# Patient Record
Sex: Male | Born: 1990 | Race: Black or African American | Hispanic: Yes | Marital: Single | Smoking: Current every day smoker
Health system: Southern US, Community
[De-identification: ages and names within clinical notes are randomized; demographics above are authoritative.]

## PROBLEM LIST (undated history)

## (undated) DIAGNOSIS — F32A Depression, unspecified: Secondary | ICD-10-CM

---
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
## 2010-06-14 ENCOUNTER — Emergency Department (HOSPITAL_COMMUNITY)
Admission: EM | Admit: 2010-06-14 | Discharge: 2010-06-14 | Disposition: A | Discharge status: Other Acute Inpatient Hospital | Payer: Self-pay | Source: Home / Self Care | Admitting: Family Medicine

## 2010-06-14 ENCOUNTER — Observation Stay (HOSPITAL_COMMUNITY)
Admission: EM | Admit: 2010-06-14 | Discharge: 2010-06-15 | Payer: Self-pay | Source: Home / Self Care | Attending: Otolaryngology | Admitting: Otolaryngology

## 2010-07-02 ENCOUNTER — Ambulatory Visit (HOSPITAL_COMMUNITY)
Admission: RE | Admit: 2010-07-02 | Discharge: 2010-07-02 | Payer: Self-pay | Source: Home / Self Care | Attending: Otolaryngology | Admitting: Otolaryngology

## 2010-07-08 ENCOUNTER — Ambulatory Visit (HOSPITAL_COMMUNITY)
Admission: RE | Admit: 2010-07-08 | Discharge: 2010-07-08 | Payer: Self-pay | Source: Home / Self Care | Attending: Otolaryngology | Admitting: Otolaryngology

## 2010-07-15 ENCOUNTER — Ambulatory Visit (HOSPITAL_COMMUNITY)
Admission: RE | Admit: 2010-07-15 | Discharge: 2010-07-15 | Payer: Self-pay | Source: Home / Self Care | Attending: Otolaryngology | Admitting: Otolaryngology

## 2010-07-17 LAB — CBC
HCT: 41.6 % (ref 39.0–52.0)
Hemoglobin: 14.4 g/dL (ref 13.0–17.0)
MCH: 31 pg (ref 26.0–34.0)
MCHC: 34.6 g/dL (ref 30.0–36.0)
MCV: 89.5 fL (ref 78.0–100.0)
Platelets: 262 10*3/uL (ref 150–400)
RBC: 4.65 MIL/uL (ref 4.22–5.81)
RDW: 12.6 % (ref 11.5–15.5)
WBC: 9.1 10*3/uL (ref 4.0–10.5)

## 2010-07-17 LAB — SURGICAL PCR SCREEN
MRSA, PCR: NEGATIVE
Staphylococcus aureus: NEGATIVE
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
## 2010-09-09 LAB — POCT I-STAT, CHEM 8
BUN: 11 mg/dL (ref 6–23)
Calcium, Ion: 1.11 mmol/L — ABNORMAL LOW (ref 1.12–1.32)
Chloride: 102 mEq/L (ref 96–112)
Creatinine, Ser: 1.1 mg/dL (ref 0.4–1.5)
Glucose, Bld: 101 mg/dL — ABNORMAL HIGH (ref 70–99)
HCT: 47 % (ref 39.0–52.0)
Hemoglobin: 16 g/dL (ref 13.0–17.0)
Potassium: 3.5 mEq/L (ref 3.5–5.1)
Sodium: 141 mEq/L (ref 135–145)
TCO2: 27 mmol/L (ref 0–100)

## 2011-06-16 ENCOUNTER — Emergency Department (HOSPITAL_COMMUNITY)
Admission: EM | Admit: 2011-06-16 | Discharge: 2011-06-17 | Discharge status: Against Medical Advice | Payer: Self-pay | Attending: Emergency Medicine | Admitting: Emergency Medicine

## 2011-06-16 ENCOUNTER — Encounter: Payer: Self-pay | Admitting: Emergency Medicine

## 2011-06-16 DIAGNOSIS — R51 Headache: Secondary | ICD-10-CM | POA: Insufficient documentation

## 2011-06-16 NOTE — ED Notes (Signed)
PT. REPORTS HEADACHE FOR 10 DAYS , DENIES INJURY ,  DENIES NAUSEA , VOMITTING OR BLURRED VISION . NO FEVER / SLIGHT CHILLS.

## 2011-06-16 NOTE — ED Notes (Signed)
Called pt in general, triage, ped waiting room and outside of ED, pt is not answer

## 2011-06-17 NOTE — ED Notes (Signed)
Called patient 3 times and NO ANSWER. 

## 2011-06-17 NOTE — ED Notes (Signed)
No answer in all waiting rooms 

## 2011-08-31 ENCOUNTER — Emergency Department (HOSPITAL_COMMUNITY)
Admission: EM | Admit: 2011-08-31 | Discharge: 2011-09-01 | Disposition: A | Discharge status: Home / Self Care | Payer: Self-pay | Attending: Emergency Medicine | Admitting: Emergency Medicine

## 2011-08-31 ENCOUNTER — Encounter (HOSPITAL_COMMUNITY): Payer: Self-pay | Admitting: *Deleted

## 2011-08-31 DIAGNOSIS — F329 Major depressive disorder, single episode, unspecified: Secondary | ICD-10-CM

## 2011-08-31 DIAGNOSIS — T50901A Poisoning by unspecified drugs, medicaments and biological substances, accidental (unintentional), initial encounter: Secondary | ICD-10-CM | POA: Insufficient documentation

## 2011-08-31 DIAGNOSIS — T50904A Poisoning by unspecified drugs, medicaments and biological substances, undetermined, initial encounter: Secondary | ICD-10-CM | POA: Insufficient documentation

## 2011-08-31 DIAGNOSIS — F3289 Other specified depressive episodes: Secondary | ICD-10-CM | POA: Insufficient documentation

## 2011-08-31 LAB — CBC
HCT: 40.6 % (ref 39.0–52.0)
Hemoglobin: 14.2 g/dL (ref 13.0–17.0)
MCH: 31.6 pg (ref 26.0–34.0)
MCHC: 35 g/dL (ref 30.0–36.0)
MCV: 90.4 fL (ref 78.0–100.0)
Platelets: 281 10*3/uL (ref 150–400)
RBC: 4.49 MIL/uL (ref 4.22–5.81)
RDW: 12.9 % (ref 11.5–15.5)
WBC: 7.9 10*3/uL (ref 4.0–10.5)

## 2011-08-31 LAB — COMPREHENSIVE METABOLIC PANEL
ALT: 13 U/L (ref 0–53)
AST: 21 U/L (ref 0–37)
Albumin: 4.1 g/dL (ref 3.5–5.2)
Alkaline Phosphatase: 94 U/L (ref 39–117)
BUN: 9 mg/dL (ref 6–23)
CO2: 30 mEq/L (ref 19–32)
Calcium: 9.3 mg/dL (ref 8.4–10.5)
Chloride: 100 mEq/L (ref 96–112)
Creatinine, Ser: 0.9 mg/dL (ref 0.50–1.35)
GFR calc Af Amer: >90 mL/min (ref >90)
GFR calc non Af Amer: >90 mL/min (ref >90)
Glucose, Bld: 80 mg/dL (ref 70–99)
Potassium: 3.2 mEq/L — ABNORMAL LOW (ref 3.5–5.1)
Sodium: 137 mEq/L (ref 135–145)
Total Bilirubin: 0.3 mg/dL (ref 0.3–1.2)
Total Protein: 7.5 g/dL (ref 6.0–8.3)

## 2011-08-31 LAB — ETHANOL: Alcohol, Ethyl (B): <11 mg/dL (ref 0–11)

## 2011-08-31 NOTE — ED Notes (Signed)
WUJ:[id number]<YN> Expected date:08/31/11<BR> Expected time: 9:50 PM<BR> Means of arrival:Ambulance<BR> Comments:<BR> Triage on arrival EMS 50 GC -- Drank eyedrops

## 2011-08-31 NOTE — ED Notes (Signed)
Per EMS- pt in c/o overdose after trying to kill himself with eye drops.

## 2011-08-31 NOTE — ED Notes (Signed)
Pt expressed wanting to leave- informed this Clinical research associate his parents were coming to get him- Spoke with ACT and EDP Clarene Duke.  IVC papers will be written for this pt.

## 2011-09-01 LAB — RAPID URINE DRUG SCREEN, HOSP PERFORMED
Amphetamines: NOT DETECTED
Barbiturates: NOT DETECTED
Benzodiazepines: NOT DETECTED
Cocaine: NOT DETECTED
Opiates: NOT DETECTED
Tetrahydrocannabinol: POSITIVE — AB

## 2011-09-01 MED ORDER — ALUM & MAG HYDROXIDE-SIMETH 200-200-20 MG/5ML PO SUSP: 30.0000 mL | ORAL | Status: DC | PRN

## 2011-09-01 MED ORDER — ONDANSETRON HCL 4 MG PO TABS: 4.0000 mg | ORAL_TABLET | Freq: Three times a day (TID) | ORAL | Status: DC | PRN

## 2011-09-01 MED ORDER — ACETAMINOPHEN 325 MG PO TABS: 650.0000 mg | ORAL_TABLET | ORAL | Status: DC | PRN

## 2011-09-01 MED ORDER — IBUPROFEN 200 MG PO TABS: 400.0000 mg | ORAL_TABLET | Freq: Three times a day (TID) | ORAL | Status: DC | PRN

## 2011-09-01 MED ORDER — NICOTINE 21 MG/24HR TD PT24: 21.0000 mg | MEDICATED_PATCH | Freq: Every day | TRANSDERMAL | Status: DC | PRN

## 2011-09-01 MED ORDER — ZOLPIDEM TARTRATE 5 MG PO TABS: 5.0000 mg | ORAL_TABLET | Freq: Every evening | ORAL | Status: DC | PRN

## 2011-09-01 MED ORDER — LORAZEPAM 1 MG PO TABS: 1.0000 mg | ORAL_TABLET | Freq: Three times a day (TID) | ORAL | Status: DC | PRN

## 2011-09-01 NOTE — BH Assessment (Signed)
IVC paperwork prepared and faxed to Hart Carwin who confirms receipt.

## 2011-09-01 NOTE — ED Provider Notes (Signed)
History     CSN: [id number]  Arrival date & time 08/31/11  2151   First MD Initiated Contact with Patient 08/31/11 2215      Chief Complaint  Patient presents with  . Medical Clearance    The history is provided by the patient and the EMS personnel. History Limited By: uncooperative.  Pt was seen at 2220.  Per pt and EMS report, pt states he "drank 1/2 a bottle of eye drops" in SA PTA.  Pt states he "just was trying to kill myself" because he has been "depressed."  Will not be forthcoming with any more information.    History reviewed. No pertinent past medical history.  History reviewed. No pertinent past surgical history.  History  Substance Use Topics  . Smoking status: Current Everyday Smoker  . Smokeless tobacco: Not on file  . Alcohol Use: No      Review of Systems  Unable to perform ROS: Other    Allergies  Review of patient's allergies indicates no known allergies.  Home Medications   Current Outpatient Rx  Name Route Sig Dispense Refill  . MOMETASONE FUROATE 50 MCG/ACT NA SUSP Nasal Place 2 sprays into the nose daily.      BP 113/79  Pulse 51  Temp(Src) 98.3 F (36.8 C) (Oral)  Resp 16  SpO2 100%  Physical Exam 2225: Physical examination:  Nursing notes reviewed; Vital signs and O2 SAT reviewed;  Constitutional: Well developed, Well nourished, Well hydrated, In no acute distress; Head:  Normocephalic, atraumatic; Eyes: EOMI, PERRL, No scleral icterus; ENMT: Mouth and pharynx normal, Mucous membranes moist; Neck: Supple, Full range of motion, No lymphadenopathy; Cardiovascular: Regular rate and rhythm, No murmur, rub, or gallop; Respiratory: Breath sounds clear & equal bilaterally, No rales, rhonchi, wheezes, or rub, Normal respiratory effort/excursion; Chest: Nontender, Movement normal; Abdomen: Soft, Nontender, Nondistended, Normal bowel sounds; Genitourinary: No CVA tenderness; Extremities: Pulses normal, No tenderness, No edema, No calf edema or  asymmetry.; Neuro: AA&Ox3, Major CN grossly intact.  No gross focal motor or sensory deficits in extremities.; Skin: Color normal, Warm, Dry; Psych:  Affect flat, poor eye contact, +depression, +SA/SI.    ED Course  Procedures  2330:  Pt telling multiple ED staff that he is "leaving now."  Concern that pt has not been completely forthcoming regarding the cause as to why he "drank eyedrops" tonight, SI/SA, etc, and he needs ACT or Telepsych eval.  +IVC paperwork completed.  Sign out to Dr. Hyacinth Meeker.    MDM  MDM Reviewed: nursing note and vitals Interpretation: labs   Results for orders placed during the hospital encounter of 08/31/11  CBC      Component Value Range   WBC 7.9  4.0 - 10.5 (K/uL)   RBC 4.49  4.22 - 5.81 (MIL/uL)   Hemoglobin 14.2  13.0 - 17.0 (g/dL)   HCT 40.9  81.1 - 91.4 (%)   MCV 90.4  78.0 - 100.0 (fL)   MCH 31.6  26.0 - 34.0 (pg)   MCHC 35.0  30.0 - 36.0 (g/dL)   RDW 78.2  95.6 - 21.3 (%)   Platelets 281  150 - 400 (K/uL)  COMPREHENSIVE METABOLIC PANEL      Component Value Range   Sodium 137  135 - 145 (mEq/L)   Potassium 3.2 (*) 3.5 - 5.1 (mEq/L)   Chloride 100  96 - 112 (mEq/L)   CO2 30  19 - 32 (mEq/L)   Glucose, Bld 80  70 - 99 (mg/dL)  BUN 9  6 - 23 (mg/dL)   Creatinine, Ser 1.61  0.50 - 1.35 (mg/dL)   Calcium 9.3  8.4 - 09.6 (mg/dL)   Total Protein 7.5  6.0 - 8.3 (g/dL)   Albumin 4.1  3.5 - 5.2 (g/dL)   AST 21  0 - 37 (U/L)   ALT 13  0 - 53 (U/L)   Alkaline Phosphatase 94  39 - 117 (U/L)   Total Bilirubin 0.3  0.3 - 1.2 (mg/dL)   GFR calc non Af Amer >90  >90 (mL/min)   GFR calc Af Amer >90  >90 (mL/min)  ETHANOL      Component Value Range   Alcohol, Ethyl (B) <11  0 - 11 (mg/dL)  URINE RAPID DRUG SCREEN (HOSP PERFORMED)      Component Value Range   Opiates NONE DETECTED  NONE DETECTED    Cocaine NONE DETECTED  NONE DETECTED    Benzodiazepines NONE DETECTED  NONE DETECTED    Amphetamines NONE DETECTED  NONE DETECTED    Tetrahydrocannabinol  POSITIVE (*) NONE DETECTED    Barbiturates NONE DETECTED  NONE DETECTED               Laray Anger, DO 09/02/11 2330

## 2011-09-01 NOTE — ED Provider Notes (Signed)
  Physical Exam  BP 102/63  Pulse 59  Temp(Src) 98.3 F (36.8 C) (Oral)  Resp 16  SpO2 100%  Physical Exam  ED Course  Procedures  MDM Change of shift, care accepted at signout. Patient reevaluated with behavioral health assessment team member, patient denies suicidal ideation or attempt. He admits that he was trying to get attention from his family by doing something out of the ordinary for him. At this time he sees that this was a mistake and can see that this was not the appropriate way to handle his discontent with this situation. He is amenable to following up as an outpatient with behavioral health, has no suicidal thoughts or intention to hurt himself at this time and has signed a safety contract prior to discharge. Involuntary commitment paper overturned, Engineer, manufacturing systems signed, patient follow up.      Vida Roller, MD 09/01/11 [phone]

## 2011-09-01 NOTE — Discharge Instructions (Signed)
 RESOURCE GUIDE  Dental Problems  Patients with Medicaid: [facility]                     [facility] [address]                                           [address] Phone:  [phone]                                                  Phone:  [phone]  If unable to pay or uninsured, contact:  [facility] or [facility] to become qualified for the adult dental clinic.  Chronic Pain Problems Contact [facility]  [phone] Patients need to be referred by their primary care doctor.  Insufficient Money for Medicine Contact [employer]:  call "211" or [facility] 271-5999.  No Primary Care Doctor Call [facility]  832-8000 Other agencies that provide inexpensive medical care    [facility]  [phone]    [facility]  [phone]    [facility]  [phone]    [facility]  [phone]    [facility]  [phone]    [facility]  [phone]  Psychological Services [facility]  [phone] [facility]  [phone] [facility]   800 853-5163 (emergency services 641-4993)  Substance Abuse Resources [facility]  336-882-2125 [facility] 336-784-9470 [facility] 336-285-9073 [facility] 336-845-3988 Residential & Outpatient Substance Abuse Program  800-659-3381  Abuse/Neglect [place] Child Abuse Hotline (336) 641-3795 [place] Child Abuse Hotline 800-378-5315 (After Hours)  Emergency Shelter  [facility] (336) 271-5985  [facility] (336) 275-9566 [facility] (704) 372-4663  MRSA Hotline #:   832-7006    [place] Resources  [facility] [place]     [employer]                          [facility] [address] [place]                       [address]      [address]                                                  [place]                            [place] Phone:  [phone]                                   Phone:  [phone]                 Phone:  [phone]  [facility] Phone:    [phone]  [place] Child Abuse Hotline (336) 342-1394 (336) 342-3537 (After Hours)   
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
## 2012-07-29 ENCOUNTER — Emergency Department (HOSPITAL_COMMUNITY)
Admission: EM | Admit: 2012-07-29 | Discharge: 2012-07-29 | Disposition: A | Discharge status: Home / Self Care | Payer: Self-pay | Attending: Emergency Medicine | Admitting: Emergency Medicine

## 2012-07-29 ENCOUNTER — Emergency Department (HOSPITAL_COMMUNITY): Payer: Self-pay

## 2012-07-29 DIAGNOSIS — R42 Dizziness and giddiness: Secondary | ICD-10-CM | POA: Insufficient documentation

## 2012-07-29 DIAGNOSIS — R51 Headache: Secondary | ICD-10-CM

## 2012-07-29 DIAGNOSIS — F17200 Nicotine dependence, unspecified, uncomplicated: Secondary | ICD-10-CM | POA: Insufficient documentation

## 2012-07-29 DIAGNOSIS — Y9389 Activity, other specified: Secondary | ICD-10-CM | POA: Insufficient documentation

## 2012-07-29 DIAGNOSIS — Y92410 Unspecified street and highway as the place of occurrence of the external cause: Secondary | ICD-10-CM | POA: Insufficient documentation

## 2012-07-29 MED ORDER — CYCLOBENZAPRINE HCL 5 MG PO TABS: 10.0000 mg | ORAL_TABLET | Freq: Two times a day (BID) | ORAL | Status: DC | PRN

## 2012-07-29 MED ORDER — IBUPROFEN 800 MG PO TABS
800.0000 mg | ORAL_TABLET | Freq: Once | ORAL | Status: AC
  Administered 2012-07-29: 800 mg via ORAL
  Filled 2012-07-29: qty 1
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
## 2012-08-02 NOTE — ED Provider Notes (Signed)
Medical screening examination/treatment/procedure(s) were performed by non-physician practitioner and as supervising physician I was immediately available for consultation/collaboration.  Doyle Kunath, MD 08/02/12 1446 
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
## 2015-12-01 ENCOUNTER — Emergency Department (HOSPITAL_COMMUNITY)
Admission: EM | Admit: 2015-12-01 | Discharge: 2015-12-01 | Disposition: A | Discharge status: Home / Self Care | Payer: No Typology Code available for payment source | Attending: Emergency Medicine | Admitting: Emergency Medicine

## 2015-12-01 ENCOUNTER — Encounter (HOSPITAL_COMMUNITY): Payer: Self-pay

## 2015-12-01 DIAGNOSIS — A6002 Herpesviral infection of other male genital organs: Secondary | ICD-10-CM | POA: Insufficient documentation

## 2015-12-01 DIAGNOSIS — A6000 Herpesviral infection of urogenital system, unspecified: Secondary | ICD-10-CM

## 2015-12-01 DIAGNOSIS — F17200 Nicotine dependence, unspecified, uncomplicated: Secondary | ICD-10-CM | POA: Insufficient documentation

## 2015-12-01 MED ORDER — ACYCLOVIR 400 MG PO TABS: 400.0000 mg | ORAL_TABLET | Freq: Three times a day (TID) | ORAL | Status: DC

## 2015-12-01 NOTE — ED Provider Notes (Signed)
CSN: [id number]     Arrival date & time 12/01/15  1746 History  By signing my name below, I, Linna DarnerRussell Turner, attest that this documentation has been prepared under the direction and in the presence of Sealed Air CorporationHeather Kamari Buch, PA-C . Electronically Signed: Linna Darnerussell Turner, Scribe. 12/01/2015. 7:01 PM.   Chief Complaint  Patient presents with  . Penis Pain    The history is provided by the patient. No language interpreter was used.     HPI Comments: Steven Hutchinson is a 25 y.o. male who presents to the Emergency Department complaining of sudden onset, constant, worsening, penile pain for the last week. Pt endorses associated worsening scratches to his foreskin; he is uncircumcised. Pt reports that he was seen here for the same symptoms recently and was told to apply neosporin to his scratches. Pt has been applying neosporin but states that he has a problem with masturbation that has not allowed his cuts to heal. Pt notes that he has not been masturbating in the last few days but was masturbating 2-3 times a day prior. He states that he has not been sexually active for the last week and a half. Pt denies dysuria, fever, swelling of his penis/scrotum, penile discharge, or any other associated symptoms.  History reviewed. No pertinent past medical history. History reviewed. No pertinent past surgical history. No family history on file. Social History  Substance Use Topics  . Smoking status: Current Every Day Smoker  . Smokeless tobacco: None  . Alcohol Use: No    Review of Systems  A complete 10 system review of systems was obtained and all systems are negative except as noted in the HPI and PMH.   Allergies  Review of patient's allergies indicates no known allergies.  Home Medications   Prior to Admission medications   Medication Sig Start Date End Date Taking? Authorizing Provider  acetaminophen (TYLENOL) 325 MG tablet Take 650 mg by mouth every 6 (six) hours as needed. Pain.    Historical  Provider, MD  cyclobenzaprine (FLEXERIL) 5 MG tablet Take 2 tablets (10 mg total) by mouth 2 (two) times daily as needed for muscle spasms. 07/29/12   Tiffany Neva SeatGreene, PA-C   BP 113/79 mmHg  Pulse 72  Temp(Src) 99.6 F (37.6 C) (Oral)  Resp 18  SpO2 99% Physical Exam  Constitutional: He is oriented to person, place, and time. He appears well-developed and well-nourished. No distress.  HENT:  Head: Normocephalic and atraumatic.  Eyes: Conjunctivae and EOM are normal.  Neck: Neck supple. No tracheal deviation present.  Cardiovascular: Normal rate and regular rhythm.   Pulmonary/Chest: Effort normal and breath sounds normal. No respiratory distress.  Genitourinary:  Ulcerative lesions of the foreskin. Inguinal lymph node on the right. No tenderness or swelling of the scrotum.  Musculoskeletal: Normal range of motion.  Neurological: He is alert and oriented to person, place, and time.  Skin: Skin is warm and dry.  Psychiatric: He has a normal mood and affect. His behavior is normal.  Nursing note and vitals reviewed.   ED Course  Procedures (including critical care time)  DIAGNOSTIC STUDIES: Oxygen Saturation is 99% on RA, normal by my interpretation.    COORDINATION OF CARE: 7:01 PM Discussed treatment plan with pt at bedside and pt agreed to plan.  Labs Review Labs Reviewed - No data to display  Imaging Review No results found. I have personally reviewed and evaluated these images and lab results as part of my medical decision-making.   EKG Interpretation  None      MDM   Final diagnoses:  None   Patient presents today with wounds of the foreskin.  He has ulcerated lesions of the foreskin area.  Appearance most consistent with Genital Herpes.  GC/Chlamydia, RPR, and HIV pending.  Patient given Rx for Acyclovir.  Stable for discharge.  Return precautions given.  I personally performed the services described in this documentation, which was scribed in my presence. The  recorded information has been reviewed and is accurate.   Santiago Glad, PA-C 12/02/15 0021  Marily Memos, MD 12/02/15 [phone]

## 2015-12-01 NOTE — ED Notes (Addendum)
States has been applying Neosporin to abrasions "cut areas" on penis. Denies any urinary problems.

## 2015-12-01 NOTE — ED Notes (Addendum)
Patient here with 1-2 weeks of penile pain. States that he has scratches on penis and he has ben cleaning same but wants to make sure they are healing. Also wants right side of groin checked for swelling

## 2015-12-01 NOTE — ED Notes (Signed)
H Laisure, PA, in w/pt. 

## 2015-12-02 LAB — RPR: RPR Ser Ql: NONREACTIVE

## 2015-12-02 LAB — HIV ANTIBODY (ROUTINE TESTING W REFLEX): HIV SCREEN 4TH GENERATION: NONREACTIVE

## 2015-12-03 LAB — GC/CHLAMYDIA PROBE AMP (~~LOC~~) NOT AT ARMC
CHLAMYDIA, DNA PROBE: NEGATIVE
Neisseria Gonorrhea: NEGATIVE

## 2022-11-05 DIAGNOSIS — L298 Other pruritus: Secondary | ICD-10-CM | POA: Diagnosis not present

## 2022-11-05 DIAGNOSIS — F319 Bipolar disorder, unspecified: Secondary | ICD-10-CM | POA: Diagnosis not present

## 2022-11-05 DIAGNOSIS — Z87891 Personal history of nicotine dependence: Secondary | ICD-10-CM | POA: Diagnosis not present

## 2022-11-05 DIAGNOSIS — R590 Localized enlarged lymph nodes: Secondary | ICD-10-CM | POA: Diagnosis not present

## 2022-11-05 DIAGNOSIS — R300 Dysuria: Secondary | ICD-10-CM | POA: Diagnosis not present

## 2022-11-05 DIAGNOSIS — Z202 Contact with and (suspected) exposure to infections with a predominantly sexual mode of transmission: Secondary | ICD-10-CM | POA: Diagnosis not present

## 2022-11-05 DIAGNOSIS — F1290 Cannabis use, unspecified, uncomplicated: Secondary | ICD-10-CM | POA: Diagnosis not present

## 2023-03-09 DIAGNOSIS — Z87891 Personal history of nicotine dependence: Secondary | ICD-10-CM | POA: Diagnosis not present

## 2023-03-09 DIAGNOSIS — B349 Viral infection, unspecified: Secondary | ICD-10-CM | POA: Diagnosis not present

## 2023-03-09 DIAGNOSIS — R52 Pain, unspecified: Secondary | ICD-10-CM | POA: Diagnosis not present

## 2023-03-09 DIAGNOSIS — R059 Cough, unspecified: Secondary | ICD-10-CM | POA: Diagnosis not present

## 2023-03-09 DIAGNOSIS — F1290 Cannabis use, unspecified, uncomplicated: Secondary | ICD-10-CM | POA: Diagnosis not present

## 2023-03-09 DIAGNOSIS — R1110 Vomiting, unspecified: Secondary | ICD-10-CM | POA: Diagnosis not present

## 2023-03-09 DIAGNOSIS — U071 COVID-19: Secondary | ICD-10-CM | POA: Diagnosis not present

## 2023-03-09 DIAGNOSIS — R531 Weakness: Secondary | ICD-10-CM | POA: Diagnosis not present

## 2023-06-14 ENCOUNTER — Ambulatory Visit (HOSPITAL_COMMUNITY)
Admission: EM | Admit: 2023-06-14 | Discharge: 2023-06-14 | Disposition: A | Discharge status: Other Acute Inpatient Hospital | Payer: 59

## 2023-06-14 ENCOUNTER — Observation Stay (HOSPITAL_COMMUNITY)
Admission: EM | Admit: 2023-06-14 | Discharge: 2023-06-17 | Disposition: A | Discharge status: Other Acute Inpatient Hospital | Payer: 59 | Attending: Family Medicine | Admitting: Family Medicine

## 2023-06-14 ENCOUNTER — Other Ambulatory Visit: Payer: Self-pay

## 2023-06-14 ENCOUNTER — Encounter (HOSPITAL_COMMUNITY): Payer: Self-pay | Admitting: *Deleted

## 2023-06-14 DIAGNOSIS — D72829 Elevated white blood cell count, unspecified: Secondary | ICD-10-CM | POA: Diagnosis not present

## 2023-06-14 DIAGNOSIS — T1491XA Suicide attempt, initial encounter: Secondary | ICD-10-CM | POA: Diagnosis not present

## 2023-06-14 DIAGNOSIS — R799 Abnormal finding of blood chemistry, unspecified: Secondary | ICD-10-CM | POA: Insufficient documentation

## 2023-06-14 DIAGNOSIS — F17210 Nicotine dependence, cigarettes, uncomplicated: Secondary | ICD-10-CM | POA: Insufficient documentation

## 2023-06-14 DIAGNOSIS — X58XXXA Exposure to other specified factors, initial encounter: Secondary | ICD-10-CM | POA: Insufficient documentation

## 2023-06-14 DIAGNOSIS — F32A Depression, unspecified: Secondary | ICD-10-CM | POA: Insufficient documentation

## 2023-06-14 DIAGNOSIS — R001 Bradycardia, unspecified: Secondary | ICD-10-CM | POA: Insufficient documentation

## 2023-06-14 DIAGNOSIS — R9431 Abnormal electrocardiogram [ECG] [EKG]: Secondary | ICD-10-CM | POA: Diagnosis not present

## 2023-06-14 DIAGNOSIS — Z8659 Personal history of other mental and behavioral disorders: Secondary | ICD-10-CM

## 2023-06-14 DIAGNOSIS — T50902A Poisoning by unspecified drugs, medicaments and biological substances, intentional self-harm, initial encounter: Principal | ICD-10-CM | POA: Diagnosis present

## 2023-06-14 DIAGNOSIS — F332 Major depressive disorder, recurrent severe without psychotic features: Secondary | ICD-10-CM | POA: Diagnosis not present

## 2023-06-14 HISTORY — DX: Depression, unspecified: F32.A

## 2023-06-14 LAB — SALICYLATE LEVEL: Salicylate Lvl: <7 mg/dL — ABNORMAL LOW (ref 7.0–30.0)

## 2023-06-14 LAB — COMPREHENSIVE METABOLIC PANEL
ALT: 21 U/L (ref 0–44)
AST: 29 U/L (ref 15–41)
Albumin: 4 g/dL (ref 3.5–5.0)
Alkaline Phosphatase: 58 U/L (ref 38–126)
Anion gap: 8 (ref 5–15)
BUN: 11 mg/dL (ref 6–20)
CO2: 24 mmol/L (ref 22–32)
Calcium: 9.2 mg/dL (ref 8.9–10.3)
Chloride: 106 mmol/L (ref 98–111)
Creatinine, Ser: 1.27 mg/dL — ABNORMAL HIGH (ref 0.61–1.24)
GFR, Estimated: >60 mL/min (ref >60)
Glucose, Bld: 78 mg/dL (ref 70–99)
Potassium: 3.9 mmol/L (ref 3.5–5.1)
Sodium: 138 mmol/L (ref 135–145)
Total Bilirubin: 0.9 mg/dL (ref <1.2)
Total Protein: 6.7 g/dL (ref 6.5–8.1)

## 2023-06-14 LAB — CBC
HCT: 42.9 % (ref 39.0–52.0)
Hemoglobin: 14.7 g/dL (ref 13.0–17.0)
MCH: 31.9 pg (ref 26.0–34.0)
MCHC: 34.3 g/dL (ref 30.0–36.0)
MCV: 93.1 fL (ref 80.0–100.0)
Platelets: 264 10*3/uL (ref 150–400)
RBC: 4.61 MIL/uL (ref 4.22–5.81)
RDW: 12.4 % (ref 11.5–15.5)
WBC: 12.8 10*3/uL — ABNORMAL HIGH (ref 4.0–10.5)
nRBC: 0 % (ref 0.0–0.2)

## 2023-06-14 LAB — RAPID URINE DRUG SCREEN, HOSP PERFORMED
Amphetamines: NOT DETECTED
Barbiturates: NOT DETECTED
Benzodiazepines: NOT DETECTED
Cocaine: NOT DETECTED
Opiates: NOT DETECTED
Tetrahydrocannabinol: POSITIVE — AB

## 2023-06-14 LAB — ACETAMINOPHEN LEVEL: Acetaminophen (Tylenol), Serum: <10 ug/mL — ABNORMAL LOW (ref 10–30)

## 2023-06-14 LAB — ETHANOL: Alcohol, Ethyl (B): <10 mg/dL (ref <10)

## 2023-06-14 NOTE — ED Notes (Signed)
Dr Monica Martinez has paperwork to start the IVC paperwork

## 2023-06-14 NOTE — Progress Notes (Signed)
   06/14/23 1943  [facility] Triage Screening (Walk-ins at [facility] only)  How Did You Hear About [place]? Legal System  What Is the Reason for Your Visit/Call Today? Steven Hutchinson is a 32 year old male presenting as a voluntary walk-in to [facility] due to SI with plan to overdose, cut self, jump in front of car or jump off bridge. Patient denied HI and psychosis. Patient reports "I had an attempt on today, I took some pills". Patient reported taking 10 pills, "I don't know what kind of pills they were, I have been throwing up then I decided to come here". Patient reported onset 2 years ago. Patient reports triggers include current indecent exposure charges. Patient states "I don't want to deal with this anymore, I have an appointment tomorrow and a court date on 06/17/23. Patient unable to contract for safety.  How Long Has This Been Causing You Problems? > than 6 months  Have You Recently Had Any Thoughts About Hurting Yourself? Yes  How long ago did you have thoughts about hurting yourself? currently suicidal  Are You Planning to Commit Suicide/Harm Yourself At This time? Yes  Have you Recently Had Thoughts About Hurting Someone Karolee Ohs? No  Are You Planning To Harm Someone At This Time? No  Physical Abuse Denies  Verbal Abuse Denies  Sexual Abuse Denies  Exploitation of patient/patient's resources Denies  Self-Neglect Denies  Possible abuse reported to:  (n/a')  Are you currently experiencing any auditory, visual or other hallucinations? No  Have You Used Any Alcohol or Drugs in the Past 24 Hours? Yes  How long ago did you use Drugs or Alcohol? yes  What Did You Use and How Much? alcohol- unknown and marijuana- unknown  Do you have any current medical co-morbidities that require immediate attention? No  Clinician description of patient physical appearance/behavior: neat / cooperative  What Do You Feel Would Help You the Most Today? Treatment for Depression or other mood problem  If access to [facility] was  not available, would you have sought care in the Emergency Department? Yes  Determination of Need Emergent (2 hours)  Options For Referral Inpatient Hospitalization;Medication Management;Outpatient Therapy  Determination of Need filed? Yes    Flowsheet Row ED from 06/14/2023 in [facility]  C-SSRS RISK CATEGORY High Risk

## 2023-06-14 NOTE — ED Notes (Signed)
Personal belongings placed with security and locker 12.

## 2023-06-14 NOTE — ED Triage Notes (Signed)
The [facility] he has taken multiple drugs from his mothers medicine approx 3-4 hours ago attempting to kill himself   on other attempt 3-4 years ago

## 2023-06-14 NOTE — ED Notes (Signed)
Pt dressed in scrubs and personal belongings removed from the pt

## 2023-06-14 NOTE — ED Provider Notes (Signed)
  [place] EMERGENCY DEPARTMENT AT [facility] Provider Note   CSN: [id number] Arrival date & time: 06/14/23  2049     History {Add pertinent medical, surgical, social history, OB history to HPI:1} Chief Complaint  Patient presents with   Psychiatric Evaluation    Steven Hutchinson is a 32 y.o. male.  HPI     Home Medications Prior to Admission medications   Medication Sig Start Date End Date Taking? Authorizing Provider  acetaminophen (TYLENOL) 325 MG tablet Take 650 mg by mouth every 6 (six) hours as needed. Pain.    [provider]  acyclovir (ZOVIRAX) 400 MG tablet Take 1 tablet (400 mg total) by mouth 3 (three) times daily. 12/01/15   Santiago Glad, PA-C  cyclobenzaprine (FLEXERIL) 5 MG tablet Take 2 tablets (10 mg total) by mouth 2 (two) times daily as needed for muscle spasms. 07/29/12   Marlon Pel, PA-C      Allergies    Patient has no known allergies.    Review of Systems   Review of Systems  Physical Exam Updated Vital Signs BP 106/72 (BP Location: Right Arm)   Pulse 67   Temp 98.1 F (36.7 C) (Oral)   Resp 15   Ht 5\' 9"  (1.753 m)   Wt 61.7 kg   SpO2 100%   BMI 20.08 kg/m  Physical Exam  ED Results / Procedures / Treatments   Labs (all labs ordered are listed, but only abnormal results are displayed) Labs Reviewed  COMPREHENSIVE METABOLIC PANEL  ETHANOL  SALICYLATE LEVEL  ACETAMINOPHEN LEVEL  CBC  RAPID URINE DRUG SCREEN, HOSP PERFORMED    EKG None  Radiology No results found.  Procedures Procedures  {Document cardiac monitor, telemetry assessment procedure when appropriate:1}  Medications Ordered in ED Medications - No data to display  ED Course/ Medical Decision Making/ A&P   {   Click here for ABCD2, HEART and other calculatorsREFRESH Note before signing :1}                              Medical Decision Making  ***  {Document critical care time when appropriate:1} {Document review of labs and  clinical decision tools ie heart score, Chads2Vasc2 etc:1}  {Document your independent review of radiology images, and any outside records:1} {Document your discussion with family members, caretakers, and with consultants:1} {Document social determinants of health affecting pt's care:1} {Document your decision making why or why not admission, treatments were needed:1} Final Clinical Impression(s) / ED Diagnoses Final diagnoses:  None    Rx / DC Orders ED Discharge Orders     None

## 2023-06-14 NOTE — ED Provider Notes (Cosign Needed Addendum)
Behavioral Health Urgent Care Medical Screening Exam  Patient Name: Steven Hutchinson MRN: [id number] Date of Evaluation: 06/14/23 Chief Complaint:   Diagnosis:  Final diagnoses:  Severe episode of recurrent major depressive disorder, without psychotic features (HCC)    History of Present illness: Steven Hutchinson is a 32 y.o. male with self-reported psychiatric history of bipolar disorder, depression, and cannabis abuse.  Patient presented voluntarily via law enforcement with chief complaint of suicidal attempt by overdose and worsening depressive symptoms.  Patient was evaluated face-to-face and his chart was reviewed by this nurse practitioner.  On assessment, patient reports worsening depressive symptoms due to life stressors.  Patient reports that he is currently on probation and has a court date scheduled for tomorrow, 06/15/23. He states his court appearance is in [facility] and he has no way of returning to [facility].  Patient reported he will go to jail if he does not show up for court.  He reports feeling overwhelmed. He says he took 10 of his mother's "diabetic and heart pills" in an attempt to end his life. He says he took the pills about 2 hours ago; he reports vomiting 1 hour post ingestion. He denies dizziness, visual changes, sweats, irritability, or palpitation. No current nausea or vomiting. Vital signs are stable: Blood pressure 113/81, pulse 78, temperature 98.8 F (37.1 C), resp. rate 18, SpO2 99%.   He denies homicidal ideation, paranoia, and visual hallucination.  He says that he sometimes experiences auditory hallucination of voices whispering "just take your life, it would be easier."  Patient reports that he was previously prescribed Zyprexa 5 mg/day.  He says that he currently does not have a psychiatric provider or a therapist and has not been on any medication for approximately 3 years.   During evaluation, patient is alert and oriented x 4.  Patient appeared anxious  but cooperative.  He is speaking in a clear tone, normal rate, and low volume.  Patient maintained minimal eye contact.  His mood is anxious and depressed, affect is congruent with mood.  Patient's thought process is coherent.  Objectively there were no signs of psychosis, mania, or delusional thought content during interaction with patient.  Patient will be transferred to the [facility] emergency room for medical clearance due to overdose on unknown medications.  Care discussed with patient and patient is in agreement.   Report called to EDP Dr. Estanislado Pandy. Patient may return to [facility] post medical clearance   Flowsheet Row ED from 06/14/2023 in [facility] Emergency Department at [facility] Most recent reading at 06/14/2023  9:19 PM ED from 06/14/2023 in [facility] Most recent reading at 06/14/2023  7:53 PM  C-SSRS RISK CATEGORY High Risk High Risk       Psychiatric Specialty Exam  Presentation  General Appearance:Appropriate for Environment  Eye Contact:Good  Speech:Clear and Coherent  Speech Volume:Normal  Handedness:Right   Mood and Affect  Mood: Depressed  Affect: Congruent   Thought Process  Thought Processes: Coherent  Descriptions of Associations:Intact  Orientation:Full (Time, Place and Person)  Thought Content:WDL    Hallucinations:None  Ideas of Reference:None  Suicidal Thoughts:No  Homicidal Thoughts:No   Sensorium  Memory: Immediate Good; Recent Good; Remote Good  Judgment: Poor  Insight: Fair   Art therapist  Concentration: Good  Attention Span: Good  Recall: Good  Fund of Knowledge: Good  Language: Good   Psychomotor Activity  Psychomotor Activity: Normal   Assets  Assets: Communication Skills;  Desire for Improvement; Housing   Sleep  Sleep: Fair  Number of hours:  6   Physical Exam: Physical Exam Vitals and nursing note reviewed.  Constitutional:       General: He is not in acute distress.    Appearance: He is well-developed. He is not ill-appearing, toxic-appearing or diaphoretic.  HENT:     Head: Normocephalic and atraumatic.  Eyes:     Conjunctiva/sclera: Conjunctivae normal.  Cardiovascular:     Rate and Rhythm: Normal rate.  Pulmonary:     Effort: Pulmonary effort is normal. No respiratory distress.  Abdominal:     Palpations: Abdomen is soft.     Tenderness: There is no abdominal tenderness.  Musculoskeletal:        General: No swelling.     Cervical back: Normal range of motion.  Skin:    General: Skin is warm and dry.  Neurological:     Mental Status: He is alert and oriented to person, place, and time.  Psychiatric:        Attention and Perception: Attention normal. He perceives auditory hallucinations.        Mood and Affect: Mood is anxious and depressed.        Speech: Speech normal.        Behavior: Behavior normal. Behavior is cooperative.        Thought Content: Thought content includes suicidal ideation. Thought content includes suicidal plan.    Review of Systems  Constitutional: Negative.   HENT: Negative.    Eyes: Negative.   Respiratory: Negative.    Cardiovascular: Negative.   Gastrointestinal: Negative.   Genitourinary: Negative.   Musculoskeletal: Negative.   Skin: Negative.   Neurological: Negative.   Endo/Heme/Allergies: Negative.   Psychiatric/Behavioral:  Positive for depression, hallucinations, substance abuse and suicidal ideas. The patient is nervous/anxious.    Blood pressure 113/81, pulse 78, temperature 98.8 F (37.1 C), resp. rate 18, SpO2 99%. There is no height or weight on file to calculate BMI.  Musculoskeletal: Strength & Muscle Tone: within normal limits Gait & Station: normal Patient leans: Right   [facility] MSE Discharge Disposition for Follow up and Recommendations: Patient will be transferred to the [facility] emergency room for medical clearance due to overdose on unknown  medications.  Care discussed with patient and patient is in agreement.   Report called to EDP Dr. Estanislado Pandy. Patient may return to [facility] post medical clearance    Maricela Bo, NP 06/14/2023, 11:18 PM

## 2023-06-15 ENCOUNTER — Encounter (HOSPITAL_COMMUNITY): Payer: Self-pay | Admitting: Internal Medicine

## 2023-06-15 DIAGNOSIS — T50902A Poisoning by unspecified drugs, medicaments and biological substances, intentional self-harm, initial encounter: Secondary | ICD-10-CM | POA: Diagnosis not present

## 2023-06-15 DIAGNOSIS — F17210 Nicotine dependence, cigarettes, uncomplicated: Secondary | ICD-10-CM | POA: Diagnosis not present

## 2023-06-15 DIAGNOSIS — F32A Depression, unspecified: Secondary | ICD-10-CM | POA: Diagnosis not present

## 2023-06-15 DIAGNOSIS — T1491XA Suicide attempt, initial encounter: Secondary | ICD-10-CM | POA: Diagnosis not present

## 2023-06-15 DIAGNOSIS — D72829 Elevated white blood cell count, unspecified: Secondary | ICD-10-CM | POA: Diagnosis present

## 2023-06-15 DIAGNOSIS — Z8659 Personal history of other mental and behavioral disorders: Secondary | ICD-10-CM

## 2023-06-15 DIAGNOSIS — R799 Abnormal finding of blood chemistry, unspecified: Secondary | ICD-10-CM | POA: Diagnosis not present

## 2023-06-15 DIAGNOSIS — R001 Bradycardia, unspecified: Secondary | ICD-10-CM | POA: Diagnosis not present

## 2023-06-15 LAB — CBC WITH DIFFERENTIAL/PLATELET
Abs Immature Granulocytes: 0.02 10*3/uL (ref 0.00–0.07)
Basophils Absolute: 0 10*3/uL (ref 0.0–0.1)
Basophils Relative: 0 %
Eosinophils Absolute: 0 10*3/uL (ref 0.0–0.5)
Eosinophils Relative: 1 %
HCT: 40.4 % (ref 39.0–52.0)
Hemoglobin: 13.9 g/dL (ref 13.0–17.0)
Immature Granulocytes: 0 %
Lymphocytes Relative: 18 %
Lymphs Abs: 1.2 10*3/uL (ref 0.7–4.0)
MCH: 32.2 pg (ref 26.0–34.0)
MCHC: 34.4 g/dL (ref 30.0–36.0)
MCV: 93.5 fL (ref 80.0–100.0)
Monocytes Absolute: 0.5 10*3/uL (ref 0.1–1.0)
Monocytes Relative: 8 %
Neutro Abs: 5.1 10*3/uL (ref 1.7–7.7)
Neutrophils Relative %: 73 %
Platelets: 235 10*3/uL (ref 150–400)
RBC: 4.32 MIL/uL (ref 4.22–5.81)
RDW: 12.3 % (ref 11.5–15.5)
WBC: 6.9 10*3/uL (ref 4.0–10.5)
nRBC: 0 % (ref 0.0–0.2)

## 2023-06-15 LAB — COMPREHENSIVE METABOLIC PANEL
ALT: 19 U/L (ref 0–44)
AST: 27 U/L (ref 15–41)
Albumin: 3.5 g/dL (ref 3.5–5.0)
Alkaline Phosphatase: 57 U/L (ref 38–126)
Anion gap: 8 (ref 5–15)
BUN: 11 mg/dL (ref 6–20)
CO2: 26 mmol/L (ref 22–32)
Calcium: 8.7 mg/dL — ABNORMAL LOW (ref 8.9–10.3)
Chloride: 106 mmol/L (ref 98–111)
Creatinine, Ser: 1.27 mg/dL — ABNORMAL HIGH (ref 0.61–1.24)
GFR, Estimated: >60 mL/min (ref >60)
Glucose, Bld: 98 mg/dL (ref 70–99)
Potassium: 3.6 mmol/L (ref 3.5–5.1)
Sodium: 140 mmol/L (ref 135–145)
Total Bilirubin: 1 mg/dL (ref <1.2)
Total Protein: 5.6 g/dL — ABNORMAL LOW (ref 6.5–8.1)

## 2023-06-15 LAB — MAGNESIUM: Magnesium: 1.9 mg/dL (ref 1.7–2.4)

## 2023-06-15 LAB — TSH: TSH: 0.86 u[IU]/mL (ref 0.350–4.500)

## 2023-06-15 MED ORDER — LACTATED RINGERS IV SOLN
INTRAVENOUS | Status: AC
Start: 2023-06-15 — End: 2023-06-15

## 2023-06-15 MED ORDER — ACETAMINOPHEN 650 MG RE SUPP: 650.0000 mg | Freq: Four times a day (QID) | RECTAL | Status: DC | PRN

## 2023-06-15 MED ORDER — ACETAMINOPHEN 325 MG PO TABS
650.0000 mg | ORAL_TABLET | Freq: Four times a day (QID) | ORAL | Status: DC | PRN
Start: 2023-06-15 — End: 2023-06-17

## 2023-06-15 MED ORDER — ONDANSETRON HCL 4 MG/2ML IJ SOLN: 4.0000 mg | Freq: Four times a day (QID) | INTRAMUSCULAR | Status: DC | PRN

## 2023-06-15 MED ORDER — ORAL CARE MOUTH RINSE: 15.0000 mL | OROMUCOSAL | Status: DC | PRN

## 2023-06-15 NOTE — Progress Notes (Addendum)
Pt was accepted to [facility] TODAY12/16/2024; Bed assignment [facility] [address]   Pt meets inpatient criteria per Teodora Medici   Attending Physician will be Dr. Eilleen Kempf  Report can be called to: [phone]  Pt can arrive after:9:00am  Care Team notified:Matthew Su Hilt, Paramedic   Kelton Pillar, LCSWA 06/15/2023 @ 1:10 AM

## 2023-06-15 NOTE — H&P (Signed)
History and Physical      Steven Hutchinson ZOX:[id number] DOB: Jun 30, 1991 DOA: 06/14/2023; DOS: 06/15/2023  PCP: Pcp, No (will further assess) Patient coming from: home   I have personally briefly reviewed patient's old medical records in [facility]  Chief Complaint: Intentional overdose  HPI: Steven Hutchinson is a 32 y.o. male with medical history significant for depression who is admitted to [facility] on 06/14/2023 with intentional overdose of unclear medication after presenting from home to [facility] ED complaining of intentional overdose.   The following history is provided by discussions with the patient as well as my assistance with the EDP as well as via chart review.  The patient presents to the emergency department this evening reporting an intentional overdose of some of his mother's home prescription medications, which she took around 1800 on 06/14/2023 with the intent to harm himself.  He is unsure as to the specific nature of these medications that he took, and is also unsure as to the specific quantity that he took.  Aside from taking his mother's prescription medications, he denies any additional recreational drug use recently, and denies any recent alcohol consumption.  Denies taking any Tylenol as a component of this intentional overdose.  He is otherwise without acute complaint at this time denies any recent chest pain, shortness of breath, cough, palpitations, diaphoresis, dizziness, presyncope, or syncope.  No recent subjective fever, chills, rigors, or generalized myalgias.  No recent dysuria or gross hematuria, or any recent abdominal discomfort or diarrhea.  Nausea at this time.  Patient has a history of depression, as well as a history of prior intentional overdose with the intent to harm himself.    ED Course:  Vital signs in the ED were notable for the following: Afebrile; heart rates in the 60s to 80s; systolic pressures in the low 811B 1 teens; respiratory  rate 15-18, oxygen saturation 98 to 100% on room air.  Labs were notable for the following: CMP notable for the following: Sodium 138, bicarbonate 24, creatinine 1.27 compared to most recent prior serum creatinine 8.5-1.1 in May 2020, glucose 78, liver enzymes were within normal limits.  Serum Tylenol level less than 10.  Salicylate level less than 7.  Serum ethanol level less than 10.  Urinary drug screen was positive for THC, but otherwise pan negative.  CBC notable for white blood count 2800, hemoglobin 14.7.  Per my interpretation, EKG in ED demonstrated the following: Sinus rhythm with heart rate 71, incomplete right bundle branch block, nonspecific T wave version in aVL, no evidence of ST changes, Cleen evidence of ST elevation.  Imaging in the ED, per corresponding formal radiology read, was notable for the following: No imaging performed in the ED today.  EDP attempted on multiple occasions to reach the patient at this mother via provided telephone number in order to further determine and the specific nature of the medications that the patient took as well as the quantity thereof, but was unable to reach the patient's mother on each of these attempts.  EDP discussed patient's case with poison control, who recommended 24-hour observation, without specific other recommendations at this time.   While in the ED, the following were administered: None.    Subsequently, the patient was admitted for observation for further evaluation management of presenting intentional overdose of unclear medication, with presenting labs notable for mild leukocytosis.     Review of Systems: As per HPI otherwise 10 point review of systems negative.  Past Medical History:  Diagnosis Date   Depression     History reviewed. No pertinent surgical history.  Social History:  reports that he has been smoking. He does not have any smokeless tobacco history on file. He reports that he does not drink alcohol and  does not use drugs.   No Known Allergies  History reviewed. No pertinent family history.   Prior to Admission medications   Medication Sig Start Date End Date Taking? Authorizing Provider  acetaminophen (TYLENOL) 325 MG tablet Take 650 mg by mouth every 6 (six) hours as needed. Pain.   Yes [provider]  ascorbic acid (VITAMIN C) 500 MG tablet Take 500 mg by mouth once a week.   Yes [provider]  Multiple Vitamin (MULTIVITAMIN WITH MINERALS) TABS tablet Take 1 tablet by mouth daily.   Yes [provider]  acyclovir (ZOVIRAX) 400 MG tablet Take 1 tablet (400 mg total) by mouth 3 (three) times daily. Patient taking differently: Take 400 mg by mouth 2 (two) times daily as needed. 12/01/15   Santiago Glad, PA-C     Objective    Physical Exam: Vitals:   06/14/23 2051 06/14/23 2115 06/14/23 2241  BP: 106/72  112/70  Pulse: 67  80  Resp: 15  18  Temp: 98.1 F (36.7 C)  98.7 F (37.1 C)  TempSrc: Oral  Oral  SpO2: 100%  98%  Weight:  61.7 kg   Height:  5\' 9"  (1.753 m)     General: appears to be stated age; alert, oriented Skin: warm, dry, no rash Head:  AT/[place] Mouth:  Oral mucosa membranes appear moist, normal dentition Neck: supple; trachea midline Heart:  RRR; did not appreciate any M/R/G Lungs: CTAB, did not appreciate any wheezes, rales, or rhonchi Abdomen: + BS; soft, ND, NT Vascular: 2+ pedal pulses b/l; 2+ radial pulses b/l Extremities: no peripheral edema, no muscle wasting Neuro: strength and sensation intact in upper and lower extremities b/l    Labs on Admission: I have personally reviewed following labs and imaging studies  CBC: Recent Labs  Lab 06/14/23 2131  WBC 12.8*  HGB 14.7  HCT 42.9  MCV 93.1  PLT 264   Basic Metabolic Panel: Recent Labs  Lab 06/14/23 2131  NA 138  K 3.9  CL 106  CO2 24  GLUCOSE 78  BUN 11  CREATININE 1.27*  CALCIUM 9.2   GFR: Estimated Creatinine Clearance: 72.9 mL/min (A) (by C-G  formula based on SCr of 1.27 mg/dL (H)). Liver Function Tests: Recent Labs  Lab 06/14/23 2131  AST 29  ALT 21  ALKPHOS 58  BILITOT 0.9  PROT 6.7  ALBUMIN 4.0   No results for input(s): "LIPASE", "AMYLASE" in the last 168 hours. No results for input(s): "AMMONIA" in the last 168 hours. Coagulation Profile: No results for input(s): "INR", "PROTIME" in the last 168 hours. Cardiac Enzymes: No results for input(s): "CKTOTAL", "CKMB", "CKMBINDEX", "TROPONINI" in the last 168 hours. BNP (last 3 results) No results for input(s): "PROBNP" in the last 8760 hours. HbA1C: No results for input(s): "HGBA1C" in the last 72 hours. CBG: No results for input(s): "GLUCAP" in the last 168 hours. Lipid Profile: No results for input(s): "CHOL", "HDL", "LDLCALC", "TRIG", "CHOLHDL", "LDLDIRECT" in the last 72 hours. Thyroid Function Tests: No results for input(s): "TSH", "T4TOTAL", "FREET4", "T3FREE", "THYROIDAB" in the last 72 hours. Anemia Panel: No results for input(s): "VITAMINB12", "FOLATE", "FERRITIN", "TIBC", "IRON", "RETICCTPCT" in the last 72 hours. Urine analysis: No results  found for: "COLORURINE", "APPEARANCEUR", "LABSPEC", "PHURINE", "GLUCOSEU", "HGBUR", "BILIRUBINUR", "KETONESUR", "PROTEINUR", "UROBILINOGEN", "NITRITE", "LEUKOCYTESUR"  Radiological Exams on Admission: No results found.    Assessment/Plan   Principal Problem:   Intentional overdose of drug in tablet form (HCC) Active Problems:   Leukocytosis   History of depression    #) Intentional overdose: Patient conveys intentional overdose on his mother's home medications, which he ingested around 1800 on 06/14/2023.  He is unsure as to the specific nature of his mother's medications, and is also unsure as to the quantity of these medications that he took at that time, but confirms that this was done with the intent to harm himself.  Attempts to reach patient's mother to further ascertain nature and quantity if this  intentional overdose of been unfruitful.  This is in the context of a documented history of depression as well as a documented history of prior intentional overdose attempts.  He is alert and oriented at this time, and appears hemodynamically stable.  No significant laboratory abnormalities, including no transaminitis, with additional pertinent labs notable for nonelevated acetaminophen level.  Additionally, EKG shows sinus rhythm without overt evidence of acute ischemic changes, no evidence of QTc prolongation.  EDP discussed patient's case with poison control, who recommended 24-hour observation, without specific other recommendations at this time.  They did not specifically recommend a repeat acetaminophen level or repeat EKG, nor any specific repeat laboratory studies.  After medically cleared following 24-hour observation per recommendation of poison control, will then consult psychiatry for further evaluation.   Of note, the patient was IVC by the EDP this evening.   Plan: Suicide precautions with continuous one-to-one observation.  Undergoing IVC completed by EDP.  Monitor on telemetry.  Repeat CMP, CBC in the morning.  Check serum magnesium level.  Repeat EKG in the morning.  Continuous lactated Ringer's at 75 cc/h x 8 hours.  In the setting of his underlying history of depression but also check TSH level.  Psychiatry consultation and following medical clearance after 24 hours following ingestion, as above.  Prn IV Zofran.                 #) Leukocytosis: Presenting CBC reflects mildly elevated white cell count of 12,800. Suspect an potential reactive element in the setting of his presenting intentional drug overdose. No evidence to suggest underlying infectious process at this time, including no report of any acute urinary symptoms or any acute respiratory symptoms.  No additional SIRS criteria met at this time. Overall, criteria for sepsis are not currently met. Appears  hemodynamically stable.  Therefore, will refrain from initiation of antibiotics at this time.  Plan: Repeat CBC with diff in the morning.  Monitor strict I's and O's, daily weights.  Continuous IV fluids overnight, as above.     DVT prophylaxis: SCD's   Code Status: Full code Family Communication: none Disposition Plan: Per Rounding Team Consults called: Poison control consulted, as further detailed above;  Admission status: Observation     I SPENT GREATER THAN 75  MINUTES IN CLINICAL CARE TIME/MEDICAL DECISION-MAKING IN COMPLETING THIS ADMISSION.      Chaney Born Liahm Grivas DO [facility]  From 7PM - 7AM   06/15/2023, 12:58 AM

## 2023-06-15 NOTE — Progress Notes (Signed)
Poison control called to get update on the pt EKG.  Information given and per Poison control requested an updated EKG and order put in.  Will call her back after it is done.
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
## 2023-06-15 NOTE — Progress Notes (Signed)
LCSW Progress Note  [id number]   BRASHAWN LEANG  06/15/2023  12:57 AM    Inpatient Behavioral Health Placement  Pt meets inpatient criteria per Ene Ajibola,NP. There are no available beds within [facility] system per [facility] AC erica Wright,RN. Referral was sent to the following facilities;   Destination  Service Provider Address Phone [facility] [address] [place], [place] [address] [phone]  [facility] Patient Placement [facility], [place] [phone]  [facility] [address] [place] [address] [phone]  [facility] [address] [place] [address] [phone]  [facility] [address] [place], [place] [address] [phone]  [facility] [address] [place] [address] [phone]  [facility] [address] [address] [phone]  [facility] [address] [place] [address] [phone]  [facility] [address] [place] [address] [phone]  [facility] [address] [place] [address] [phone]  [facility] [address], [place] [address] [phone]  [facility] [address] [place] [address] [phone]  [facility] [place] [phone]  [facility] [address] [place], [place] [phone]  [facility] [address] [place] [address] [phone]  [facility] [address] [place], [place] [address] [phone]  [facility] [address] [place], [place] [address] [phone]  [facility] [address], [place] [address] [phone]  [facility] [place] [phone]  [facility] [address], [place] [address] [phone]  [facility] [address], [place] [address] [phone]  [facility] [address] [place], [place] [address] [phone]  [facility] [address], [place] [address] [phone]    Situation ongoing,  CSW will follow up.    Maryjean Ka, MSW, [facility] 06/15/2023 12:57 AM

## 2023-06-15 NOTE — ED Notes (Signed)
[facility] department aware of pt's hospital admission and will not be coming to facility.

## 2023-06-15 NOTE — ED Notes (Signed)
ED TO INPATIENT HANDOFF REPORT  ED Nurse Name and Phone #:  Theophilus Bones [phone]  S Name/Age/Gender Steven Hutchinson 32 y.o. male Room/Bed: 041C/041C  Code Status   Code Status: Full Code  Home/SNF/Other Home Patient oriented to: self, place, time, and situation Is this baseline? Yes   Triage Complete: Triage complete  Chief Complaint Intentional overdose of drug in tablet form (HCC) [T50.902A]  Triage Note The pt [facility] he has taken multiple drugs from his mothers medicine approx 3-4 hours ago attempting to kill himself   on other attempt 3-4 years ago   Allergies No Known Allergies  Level of Care/Admitting Diagnosis ED Disposition     ED Disposition  Admit   Condition  --   Comment  [facility] [[id number]]  Level of Care: Telemetry Medical [104]  May place patient in observation at [facility] or [facility] if equivalent level of care is available:: No  Covid Evaluation: Asymptomatic - no recent exposure (last 10 days) testing not required  Diagnosis: Intentional overdose of drug in tablet form [facility]) [[id number]]  Admitting Physician: Angie Fava [[id number]]  Attending Physician: Angie Fava [[id number]]          B Medical/Surgery History Past Medical History:  Diagnosis Date   Depression    History reviewed. No pertinent surgical history.   A IV Location/Drains/Wounds Patient Lines/Drains/Airways Status     Active Line/Drains/Airways     Name Placement date Placement time Site Days   Peripheral IV 06/15/23 20 G Anterior;Right Forearm 06/15/23  0236  Forearm  less than 1            Intake/Output Last 24 hours  Intake/Output Summary (Last 24 hours) at 06/15/2023 0915 Last data filed at 06/15/2023 0720 Gross per 24 hour  Intake 581.25 ml  Output --  Net 581.25 ml    Labs/Imaging Results for orders placed or performed during the hospital encounter of 06/14/23 (from the past 48 hours)   Rapid urine drug screen (hospital performed)     Status: Abnormal   Collection Time: 06/14/23  9:27 PM  Result Value Ref Range   Opiates NONE DETECTED NONE DETECTED   Cocaine NONE DETECTED NONE DETECTED   Benzodiazepines NONE DETECTED NONE DETECTED   Amphetamines NONE DETECTED NONE DETECTED   Tetrahydrocannabinol POSITIVE (A) NONE DETECTED   Barbiturates NONE DETECTED NONE DETECTED    Comment: (NOTE) DRUG SCREEN FOR MEDICAL PURPOSES ONLY.  IF CONFIRMATION IS NEEDED FOR ANY PURPOSE, NOTIFY LAB WITHIN 5 DAYS.  LOWEST DETECTABLE LIMITS FOR URINE DRUG SCREEN Drug Class                     Cutoff (ng/mL) Amphetamine and metabolites    1000 Barbiturate and metabolites    200 Benzodiazepine                 200 Opiates and metabolites        300 Cocaine and metabolites        300 THC                            50 Performed at [facility] Lab, [address] [place], [place] [address]   Comprehensive metabolic panel     Status: Abnormal   Collection Time: 06/14/23  9:31 PM  Result Value Ref Range   Sodium 138 135 - 145 mmol/L   Potassium  3.9 3.5 - 5.1 mmol/L   Chloride 106 98 - 111 mmol/L   CO2 24 22 - 32 mmol/L   Glucose, Bld 78 70 - 99 mg/dL    Comment: Glucose reference range applies only to samples taken after fasting for at least 8 hours.   BUN 11 6 - 20 mg/dL   Creatinine, Ser 4.09 (H) 0.61 - 1.24 mg/dL   Calcium 9.2 8.9 - 81.1 mg/dL   Total Protein 6.7 6.5 - 8.1 g/dL   Albumin 4.0 3.5 - 5.0 g/dL   AST 29 15 - 41 U/L   ALT 21 0 - 44 U/L   Alkaline Phosphatase 58 38 - 126 U/L   Total Bilirubin 0.9 <1.2 mg/dL   GFR, Estimated >91 >47 mL/min    Comment: (NOTE) Calculated using the CKD-EPI Creatinine Equation (2021)    Anion gap 8 5 - 15    Comment: Performed at [facility] Lab, [address], [place], [place] [address]  Ethanol     Status: None   Collection Time: 06/14/23  9:31 PM  Result Value Ref Range   Alcohol, Ethyl (B) <10 <10 mg/dL    Comment:  (NOTE) Lowest detectable limit for serum alcohol is 10 mg/dL.  For medical purposes only. Performed at [facility] Lab, [address], [place], [place] [address]   Salicylate level     Status: Abnormal   Collection Time: 06/14/23  9:31 PM  Result Value Ref Range   Salicylate Lvl <7.0 (L) 7.0 - 30.0 mg/dL    Comment: Performed at [facility] Lab, [address], [place], [place] [address]  Acetaminophen level     Status: Abnormal   Collection Time: 06/14/23  9:31 PM  Result Value Ref Range   Acetaminophen (Tylenol), Serum <10 (L) 10 - 30 ug/mL    Comment: (NOTE) Therapeutic concentrations vary significantly. A range of 10-30 ug/mL  may be an effective concentration for many patients. However, some  are best treated at concentrations outside of this range. Acetaminophen concentrations >150 ug/mL at 4 hours after ingestion  and >50 ug/mL at 12 hours after ingestion are often associated with  toxic reactions.  Performed at [facility] Lab, [address] [place], [place] [address]   cbc     Status: Abnormal   Collection Time: 06/14/23  9:31 PM  Result Value Ref Range   WBC 12.8 (H) 4.0 - 10.5 K/uL   RBC 4.61 4.22 - 5.81 MIL/uL   Hemoglobin 14.7 13.0 - 17.0 g/dL   HCT 52.8 41.3 - 24.4 %   MCV 93.1 80.0 - 100.0 fL   MCH 31.9 26.0 - 34.0 pg   MCHC 34.3 30.0 - 36.0 g/dL   RDW 01.0 27.2 - 53.6 %   Platelets 264 150 - 400 K/uL   nRBC 0.0 0.0 - 0.2 %    Comment: Performed at [facility] Lab, [address], [place], [place] [address]   No results found.  Pending Labs Unresulted Labs (From admission, onward)     Start     Ordered   06/15/23 0630  TSH  Add-on,   AD        06/15/23 0629   06/15/23 0500  CBC with Differential/Platelet  Tomorrow morning,   R        06/15/23 0057   06/15/23 0500  Comprehensive metabolic panel  Tomorrow morning,   R        06/15/23 0057   06/15/23  0058  Magnesium  Add-on,   AD        06/15/23 0057             Vitals/Pain Today's Vitals   06/14/23 2241 06/15/23 0730 06/15/23 0748 06/15/23 0800  BP: 112/70 113/72  102/74  Pulse: 80 66  (!) 56  Resp: 18 16  13   Temp: 98.7 F (37.1 C)  98.1 F (36.7 C)   TempSrc: Oral  Oral   SpO2: 98% 100%  100%  Weight:      Height:      PainSc: 3        Isolation Precautions No active isolations  Medications Medications  acetaminophen (TYLENOL) tablet 650 mg (has no administration in time range)    Or  acetaminophen (TYLENOL) suppository 650 mg (has no administration in time range)  ondansetron (ZOFRAN) injection 4 mg (has no administration in time range)  lactated ringers infusion (0 mLs Intravenous Stopped 06/15/23 0720)    Mobility walks     Focused Assessments Intentional Overdose/IVC   R Recommendations: See Admitting Provider Note  Report given to:   Additional Notes:

## 2023-06-15 NOTE — Plan of Care (Signed)
  Problem: Education: Goal: Knowledge of General Education information will improve Description Including pain rating scale, medication(s)/side effects and non-pharmacologic comfort measures Outcome: Progressing   

## 2023-06-15 NOTE — Progress Notes (Signed)
   PROGRESS NOTE    DOMINYC Hutchinson  ZOX:[id number] DOB: 1991-02-12 DOA: 06/14/2023 PCP: Oneita Hurt, No   Brief Narrative: Steven Hutchinson is a 32 y.o. male with a history of depression.  Patient presented secondary to intentional overdose.   Assessment/Plan:  Intentional overdose Patient with intentional overdose of unknown medication. Psychiatry consulted with recommendation for inpatient behavioral health admission. Patient IVCd. Poison control contacted with recommendations for 24 hours of medical observation prior to discharge. -Continue IVC -Ongoing psychiatry recommendations  Depression Patient is not on medication management as an outpatient. Psychiatry consulted.  Leukocytosis WBC of 12,800 on admission. Unclear etiology. No evidence of acute illness.  Elevated creatinine Mild. Creatinine of 1.27 on admission. No recent baseline available.   DVT prophylaxis: SCDs Code Status:   Code Status: Full Code Family Communication: None at bedside Disposition Plan: Discharge to behavioral health likely in 24 hours   Consultants:  Psychiatry  Procedures:  None  Antimicrobials: None    Subjective: No concerns overnight.  Objective: BP 102/74   Pulse (!) 56   Temp 98.1 F (36.7 C) (Oral)   Resp 13   Ht 5\' 9"  (1.753 m)   Wt 61.7 kg   SpO2 100%   BMI 20.08 kg/m   Examination:  General exam: Appears calm and comfortable Respiratory system: Clear to auscultation. Respiratory effort normal. Cardiovascular system: S1 & S2 heard, RRR. No murmurs, rubs, gallops or clicks. Gastrointestinal system: Abdomen is nondistended, soft and nontender. No organomegaly or masses felt. Normal bowel sounds heard. Central nervous system: Alert and oriented. No focal neurological deficits. Musculoskeletal: No edema. No calf tenderness Skin: No cyanosis. No rashes Psychiatry: Patient declines SI this morning    Data Reviewed: I have personally reviewed following labs and imaging  studies   Last CBC Lab Results  Component Value Date   WBC 12.8 (H) 06/14/2023   HGB 14.7 06/14/2023   HCT 42.9 06/14/2023   MCV 93.1 06/14/2023   MCH 31.9 06/14/2023   RDW 12.4 06/14/2023   PLT 264 06/14/2023     Last metabolic panel Lab Results  Component Value Date   GLUCOSE 78 06/14/2023   NA 138 06/14/2023   K 3.9 06/14/2023   CL 106 06/14/2023   CO2 24 06/14/2023   BUN 11 06/14/2023   CREATININE 1.27 (H) 06/14/2023   GFRNONAA >60 06/14/2023   CALCIUM 9.2 06/14/2023   PROT 6.7 06/14/2023   ALBUMIN 4.0 06/14/2023   BILITOT 0.9 06/14/2023   ALKPHOS 58 06/14/2023   AST 29 06/14/2023   ALT 21 06/14/2023   ANIONGAP 8 06/14/2023     Creatinine Clearance: Estimated Creatinine Clearance: 72.9 mL/min (A) (by C-G formula based on SCr of 1.27 mg/dL (H)).  No results found for this or any previous visit (from the past 240 hours).    Radiology Studies: No results found.    LOS: 0 days    Jacquelin Hawking, MD [facility] 06/15/2023, 9:13 AM   If 7PM-7AM, please contact night-coverage [email or web address]

## 2023-06-15 NOTE — ED Notes (Signed)
Envelope #[id number] has been submitted successfully.

## 2023-06-15 NOTE — Consult Note (Signed)
Brief Psychiatry Consult Note  Pt seen by ED psych team earlier on this date of service and recommended for inpt psychiatry after medical admission. Was put under IVC in ED and accepted to [facility] but ultimately admitted medically and transfer held. Consult team consulted for continuity of care. Have briefly reviewed note by [facility] and by [name] @ [facility] and agree with inpt determination made by [name], NP. Consult team will see formally tomorrow if pt remains in the hospital and has not found a psych bed.   - appreciate consult - will see formally tomorrow d/w primary team - no charge  Claris Che A Nezzie Manera

## 2023-06-15 NOTE — TOC Progression Note (Signed)
Transition of Care [facility]) - Progression Note    Patient Details  Name: Steven Hutchinson MRN: [id number] Date of Birth: Mar 13, 1991  [facility] CM/SW Contact  Carley Hammed, LCSW Phone Number: 06/15/2023, 1:00 PM  Clinical Narrative:     CSW reviewed chart on admission, noting IVC status with an IP psych placement disposition. Per chart review, pt with bed at [facility], DC held due to ongoing medical workup. CSW to follow back up with facility when medically stable. TOC will continue to follow.        Expected Discharge Plan and Services                                               Social Determinants of Health (SDOH) Interventions SDOH Screenings   Food Insecurity: Food Insecurity Present (06/15/2023)  Housing: High Risk (06/15/2023)  Transportation Needs: Unmet Transportation Needs (06/15/2023)  Utilities: At Risk (06/15/2023)  Tobacco Use: High Risk (06/15/2023)    Readmission Risk Interventions     No data to display

## 2023-06-15 NOTE — Progress Notes (Addendum)
Inpatient Behavioral Health Placement  Pt meets inpatient criteria per Ene Ajibola,NP.  Pt transferring to [facility].   Maryjean Ka, MSW, Theresia Majors

## 2023-06-15 NOTE — ED Notes (Signed)
IVC paperwork complete , original in red folder, 3 copies on purple folder in yellow zone with pt case number [id number]

## 2023-06-15 NOTE — Progress Notes (Signed)
Requested EKG done and results called into the poison control center to [place].

## 2023-06-16 DIAGNOSIS — T50902A Poisoning by unspecified drugs, medicaments and biological substances, intentional self-harm, initial encounter: Secondary | ICD-10-CM | POA: Diagnosis not present

## 2023-06-16 MED ORDER — LAMOTRIGINE 25 MG PO TABS
25.0000 mg | ORAL_TABLET | Freq: Every day | ORAL | Status: DC
  Administered 2023-06-16: 25 mg via ORAL
  Filled 2023-06-16 (×2): qty 1

## 2023-06-16 NOTE — TOC Progression Note (Addendum)
Transition of Care [facility]) - Progression Note    Patient Details  Name: Steven Hutchinson MRN: [id number] Date of Birth: 1990-10-17  Transition of [facility]) CM/SW Contact  Carley Hammed, LCSW Phone Number: 06/16/2023, 12:55 PM  Clinical Narrative:     CSW notified by medical team that pt is cleared to DC to IP Psych when bed is available. CSW followed up with [facility] where pt was previously accepted. They noted pt does not need a new referral and they will call back with bed availability.  MD requested updates and CSW spoke with facility again. CSW told now pt does need a new referral and there may be a bed available tomorrow. CSW sent in referral and updated medical team. TOC will continue to follow.    Pt can DC to [facility] after 10 AM tomorrow. Accepting MD is Dr. Aida Puffer. Call to report is [phone]. Confirmed IVC paperwork on chart, pt will have to be transported via sheriff. TOC continues to follow.     Expected Discharge Plan and Services                                               Social Determinants of Health (SDOH) Interventions SDOH Screenings   Food Insecurity: Food Insecurity Present (06/15/2023)  Housing: High Risk (06/15/2023)  Transportation Needs: Unmet Transportation Needs (06/15/2023)  Utilities: At Risk (06/15/2023)  Tobacco Use: High Risk (06/15/2023)    Readmission Risk Interventions     No data to display

## 2023-06-16 NOTE — Hospital Course (Signed)
Steven Hutchinson is a 32 y.o. male with a history of depression.  Patient presented secondary to intentional overdose.  He was observed via telemetry with evidence of asymptomatic sinus bradycardia. Psychiatry recommendation for inpatient behavioral health admission.

## 2023-06-16 NOTE — Consult Note (Signed)
[facility] Psychiatric Consult Initial  Patient Name: .Steven Hutchinson  MRN: [id number]  DOB: 23-Sep-1990  Consult Order details:  Orders (From admission, onward)     Start     Ordered   06/15/23 1134  IP CONSULT TO PSYCHIATRY       Ordering Provider: Narda Bonds, MD  Provider:  (Not yet assigned)  Question Answer Comment  Location [facility]   Reason for Consult? 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  Also reports taking methylphenidate as child)  Assessment  Steven Hutchinson is a 32 y.o. male admitted: Medicallyfor 06/14/2023  8:49 PM for suicide attempt by medication overdose. He carries the unclear past psychiatric history of MDD and has a no significant past medical history.   His current presentation of suicide attempt, current and past major depressive episodes, past hypomanic episode, mood congruent auditory hallucinations during major depressive episode in past, and traumatic experiences with PTSD symptoms is most consistent with bipolar II disorder, current depressed and PTSD.  Patient is already been IVC, which appropriate to continue, and he is open to going voluntarily.  He is not currently on any outpatient psychiatric medications and has minimal past psychiatric medication history.  On initial examination, patient no longer is endorsing SI but reports significant reduced mood and psychosocial distress and is asking for inpatient psych  treatment, being started medications, and to establish with outpatient follow-up for medication management and therapy.  Due to the severity of depressive episodes and concern for single hypomanic episode the past we are opting to treat depression and PTSD but mindful of preventing conversion to (hypo)mania.  We recommended lamotrigine, which patient is open to initiating, and SSRI (likely sertraline) which patient is hesitant to initiate at this time due to side effect concerns.  Please see plan below for detailed recommendations.   Triggers: Missing court date on 12/16    Diagnoses:  Active Hospital problems: Principal Problem:   Intentional overdose of drug in tablet form (HCC) Active Problems:   Leukocytosis   History of depression    Plan   ## Psychiatric Medication Recommendations:  -- start lamictal 25 once daily at bedtime for bipolar depression -- continue discussions around starting SSRI for depression and PTSD  ## Medical Decision Making Capacity: Not specifically addressed in this encounter  ## Further Work-up:  --None recommended  -- most recent EKG on 06/15/23 had QtC of 366 -- Pertinent labwork reviewed earlier this admission includes: UDS positive for [facility]   ## Disposition:-- We recommend inpatient psychiatric hospitalization after medical hospitalization. Patient has been involuntarily committed on 06/14/23. Appears to be accepted to [facility].   ## Behavioral / Environmental: - No specific recommendations at this time.     ## Safety and Observation Level:  - Based on my clinical evaluation, I estimate the patient to be at high risk of self harm in the current setting. - At this time, we recommend  1:1 Observation. This  decision is based on my review of the chart including patient's history and current presentation, interview of the patient, mental status examination, and consideration of suicide risk including evaluating suicidal ideation, plan, intent,  suicidal or self-harm behaviors, risk factors, and protective factors. This judgment is based on our ability to directly address suicide risk, implement suicide prevention strategies, and develop a safety plan while the patient is in the clinical setting. Please contact our team if there is a concern that risk level has changed.  CSSR Risk Category:C-SSRS RISK CATEGORY: High Risk  Suicide Risk Assessment: Patient has following modifiable risk factors for suicide: untreated depression and lack of access to outpatient mental health resources, which we are addressing by initiating medications, inpatient treatment, and outpatient follow up. Patient has following non-modifiable or demographic risk factors for suicide: male gender and psychiatric hospitalization Patient has the following protective factors against suicide: Supportive partner and no history of NSSIB  Thank you for this consult request. Recommendations have been communicated to the primary team.  We will continue to follow at this time.   Meryl Dare, MD       History of Present Illness  Relevant Aspects of [facility] Course:  Admitted on 06/14/2023 for suicide attempt by overdose and worsening depression.  Patient was recommended for inpatient psychiatry but was medically admitted due to medication overdose but uneventful hospital course after 24 hours and likely medically stable on 12/17.   Patient Report:  Pt reports that he engaged in a suicide attempt in order to get help. He reports that he has fmaily and friends but he does not feel supported by them.  Reports he has a girlfriend who is supportive but that feels like he holds her back (for example from going to college).  Reports that has been incarcerated the past and that this has caused a lot of distress in his life including traumas which she has not been looking at past.  Reports that ongoing issues with probation have also limited him.  Reports that for his entire  life he has had poor social support and has been told that he is a failure throughout his life which he has internalized.  Reports that he is presenting for treatment because he wants to get help  Psych ROS:  Depression: Endorses worsening depression, although endorses low on depression scale (see below). Suicide attempt recently. Reports recurrent struggles with depression to point that he wants medication and therapy.   Anxiety: describes anxiety that appears more in line with symptoms of depression including distress, physiologic reactions to trauma reminders, anger outbursts with little or no provocation, etc.   Mania (lifetime and current): Patient experiences in the past a distinct period of abnormally and persistently expansive mood and increased activity / energy lasting at least 4 consecutive days. During this time period, the patient also demonstrated inflated self-esteem / grandiosity, decreased need for sleep, flight of ideas / subjective experience of racing thoughts, and increase in goal-directed activity / psychomotor agitation. These symptoms are not attributable to any identifiable substances. This mood disturbance was NOT sufficiently severe enough to cause impairment in functioning, necessitate hospitalization to prevent harm to self / others, or have psychotic features present  Psychosis: (lifetime and current): Per chart review denied house ideation, paranoia Compas hallucinations.  Reports sometimes hearing voices saying "just take your life, it would be easier."  Has been prescribed Zyprexa in the past with benefit.   Trauma: Patient experienced repeated / extreme exposure to aversive details  of traumatic event/s, namely imprisonment and "fighting for his life ." He exhibits symptoms that cause clinically significant distress / impairment in social, occupational, or other areas of functioning. For > 1 month, he has been: Experiencing recurrent, involuntary, and intrusive  distressing memories of the trauma, dissociative reactions (e.g. flashbacks) in which they feel / act as if the trauma were recurring, and physiologic reactions to internal / external cues that symbolize or resemble an aspect of the trauma Avoiding distressing memories, thoughts, or feelings about or closely associated with the trauma Having persistent and exaggerated negative beliefs / expectations about oneself, others, or the world, persistent negative emotional state (e.g. fear, horror, anger, guilt, or shame), markedly diminished interest or participation in significant activities, and persistent inability to experience positive emotions  Endorsing irritability / angry outbursts (with little or no provocation) typically expressed as verbal or physical aggression toward people or objects, reckless / self-destructive behavior, and problems with concentration    Collateral information:  No collateral collected at this time.   Review of Systems  Psychiatric/Behavioral:  Positive for depression and suicidal ideas. Negative for hallucinations and substance abuse.      Psychiatric and Social History  Psychiatric History:  Information collected from patient and chart review.   Prev Dx/Sx: Bipolar depression per patient without documented, ADHD per patient and parents (report from patient who talk to family) Current Psych Provider: None Home Meds (current): none currently Previous Med Trials: Been prescribed Zyprexa in the past, reported taking Ritalin as a child which seems most consistent with ADHD Therapy: none  Prior Psych Hospitalization: Patient reports past hospitalization for depression but not seen in chart. Prior Self Harm: Suicide attempt in 2013, discharged with safety planning from ED. Prior Violence: Reports fight or flight response when confronted aggressively, consistent with PTSD  Family Psych History: Depression anxiety in first-degree family members Family Hx suicide:  Denies  Social History:  Developmental Hx: Reported growing up in a household that consistently told him that he was a failure. Educational Hx: High school completed Occupational Hx: Did not discuss Legal Hx: This court date on 12/16 in [facility], which patient reports he would go to jail for.  Reports his he has been incarcerated in the past. Living Situation: Living with family currently Spiritual Hx: Christian Access to weapons/lethal means: None  Substance History Denies all substance use history except for daily cannabis use.  Reports smoking blunts (contains nicotine) and that he is up to 10 g/week.  Typically smokes delta 8  Exam Findings  Physical Exam:  Vital Signs:  Temp:  [98.4 F (36.9 C)-99.4 F (37.4 C)] 99.4 F (37.4 C) (12/17 1612) Pulse Rate:  [57-73] 64 (12/17 1612) Resp:  [17-18] 18 (12/17 1612) BP: (104-117)/(71-86) 104/71 (12/17 1612) SpO2:  [100 %] 100 % (12/17 1612) Blood pressure 104/71, pulse 64, temperature 99.4 F (37.4 C), temperature source Oral, resp. rate 18, height 5\' 9"  (1.753 m), weight 61.7 kg, SpO2 100%. Body mass index is 20.08 kg/m.  Physical Exam Vitals and nursing note reviewed.  Constitutional:      Comments: Wearing disposable gown  HENT:     Head: Normocephalic and atraumatic.  Pulmonary:     Effort: Pulmonary effort is normal.  Neurological:     General: No focal deficit present.     Mental Status: He is alert.  Psychiatric:     Comments: No obvious EPS symptoms.     Mental Status Exam: General Appearance: Casual and disposable gown  Orientation:  Full (Time, Place, and Person)  Memory:  Immediate;   Good Recent;   Good Remote;   Good  Concentration:  Concentration: Good and Attention Span: Good  Recall:  Good  Attention  Good  Eye Contact:  Minimal stays at the wall while discussing story  Speech:  Pressured evident by talking for almost 30 minutees without pasuing for interviewer to speak but once rapid firing  questions can be interrupted more easily.   Language:  Good  Volume:  Normal  Mood: depressed  Affect:  Congruent, Full Range, and Tearful. Hopelessness  Thought Process:  Coherent and Linear  Thought Content:  Logical and themes of needing social/emotional support in his life.  Themes of hopelessness  Suicidal Thoughts:  No, not currently  Homicidal Thoughts:  No  Judgement:  Poor, knows what needs doing but hasn't done  Insight:  Fair, understand self well and dysfunctional aspects of life  Psychomotor Activity:  Normal  Akathisia:  No  Fund of Knowledge:  Good      Assets:  Communication Skills Desire for Improvement Physical Health Social Support  Cognition:  WNL  ADL's:  Intact  AIMS (if indicated):   not on antipsychotic or long term hx      Hospital Anxiety Depression Scale (HADS) 06/16/23  Anxiety Subscale (HADS-A): 6 1. I feel tense or 'wound up': 1 2. I get a sort of frightened feeling as if something awful is about to happen: 0 3. Worrying thoughts go through my mind: 1 4. I can sit at ease and feel relaxed: 1 5. I get a sort of frightened feeling like 'butterflies' in the stomach: 1 6. I feel restless as if I have to be on the move: 1 7. I get sudden feelings of panic: 1  Depression Subscale (HADS-D): 2 1. I still enjoy the things I used to enjoy: 0 2. I can laugh and see the funny side of things: 0 3. I feel cheerful: 1 4. I feel as if I am slowed down: 0 5. I have lost interest in my appearance: 1 6. I look forward with enjoyment to things: 0 7. I can enjoy a good book or radio or TV program: 0  Each item is scored on a scale from 0 to 3, with higher scores indicating greater levels of anxiety or depression. The total score for each subscale ranges from 0 to 21, with scores of 8-10 indicating mild symptoms, 11-14 indicating moderate symptoms, and 15-21 indicating severe symptoms.   Other History   These have been pulled in through the EMR, reviewed, and  updated if appropriate.  Family History:  The patient's family history is not on file.  Medical History: Past Medical History:  Diagnosis Date   Depression     Surgical History: History reviewed. No pertinent surgical history.   Medications:   Current Facility-Administered Medications:    acetaminophen (TYLENOL) tablet 650 mg, 650 mg, Oral, Q6H PRN **OR** acetaminophen (TYLENOL) suppository 650 mg, 650 mg, Rectal, Q6H PRN, Howerter, Justin B, DO   lamoTRIgine (LAMICTAL) tablet 25 mg, 25 mg, Oral, QHS, Pritesh Sobecki, MD   ondansetron (ZOFRAN) injection 4 mg, 4 mg, Intravenous, Q6H PRN, Howerter, Justin B, DO   Oral care mouth rinse, 15 mL, Mouth Rinse, PRN, Narda Bonds, MD  Allergies: No Known Allergies  Meryl Dare, MD

## 2023-06-16 NOTE — Progress Notes (Signed)
   PROGRESS NOTE    [name]  VHQ:[id number] DOB: [birth year] DOA: 06/14/2023 PCP: Oneita Hurt, No   Brief Narrative: Steven Hutchinson is a 32 y.o. male with a history of depression.  Patient presented secondary to intentional overdose. Plan for discharge to behavioral health.   Assessment/Plan:  Intentional overdose Patient with intentional overdose of unknown medication. Psychiatry consulted with recommendation for inpatient behavioral health admission. Patient IVCd. Poison control contacted with recommendations for 24 hours of medical observation prior to discharge, which is completed.   Depression Patient is not on medication management as an outpatient. Psychiatry consulted.   Leukocytosis WBC of 12,800 on admission. Unclear etiology. No evidence of acute illness. Resolved.   Elevated creatinine Mild. Creatinine of 1.27 on admission. No recent baseline available. Stable.   Sinus bradycardia Heart rate as low as 40s overnight. Improved to 60s prior to discharge. Bradycardia was sinus and asymptomatic. No intervention needed.   DVT prophylaxis: SCDs Code Status:   Code Status: Full Code Family Communication: None at bedside Disposition Plan: Medically stable for discharge to behavioral health   Consultants:  Psychiatry  Procedures:  None  Antimicrobials: None    Subjective: No issues to report. Patient feels better today.  Objective: BP 117/86 (BP Location: Right Arm)   Pulse 73   Temp 98.4 F (36.9 C) (Oral)   Resp 17   Ht 5\' 9"  (1.753 m)   Wt 61.7 kg   SpO2 100%   BMI 20.08 kg/m   Examination:  General exam: Appears calm and comfortable Respiratory system: Respiratory effort normal. Central nervous system: Alert and oriented.   Data Reviewed: I have personally reviewed following labs and imaging studies   Last CBC Lab Results  Component Value Date   WBC 6.9 06/15/2023   HGB 13.9 06/15/2023   HCT 40.4 06/15/2023   MCV 93.5 06/15/2023   MCH  32.2 06/15/2023   RDW 12.3 06/15/2023   PLT 235 06/15/2023     Last metabolic panel Lab Results  Component Value Date   GLUCOSE 98 06/15/2023   NA 140 06/15/2023   K 3.6 06/15/2023   CL 106 06/15/2023   CO2 26 06/15/2023   BUN 11 06/15/2023   CREATININE 1.27 (H) 06/15/2023   GFRNONAA >60 06/15/2023   CALCIUM 8.7 (L) 06/15/2023   PROT 5.6 (L) 06/15/2023   ALBUMIN 3.5 06/15/2023   BILITOT 1.0 06/15/2023   ALKPHOS 57 06/15/2023   AST 27 06/15/2023   ALT 19 06/15/2023   ANIONGAP 8 06/15/2023     Creatinine Clearance: Estimated Creatinine Clearance: 72.9 mL/min (A) (by C-G formula based on SCr of 1.27 mg/dL (H)).  No results found for this or any previous visit (from the past 240 hours).    Radiology Studies: No results found.    LOS: 0 days    Jacquelin Hawking, MD [facility] 06/16/2023, 1:01 PM   If 7PM-7AM, please contact night-coverage [email or web address]

## 2023-06-17 DIAGNOSIS — Z9151 Personal history of suicidal behavior: Secondary | ICD-10-CM | POA: Diagnosis not present

## 2023-06-17 DIAGNOSIS — F332 Major depressive disorder, recurrent severe without psychotic features: Secondary | ICD-10-CM | POA: Diagnosis not present

## 2023-06-17 DIAGNOSIS — F1020 Alcohol dependence, uncomplicated: Secondary | ICD-10-CM | POA: Diagnosis not present

## 2023-06-17 DIAGNOSIS — F314 Bipolar disorder, current episode depressed, severe, without psychotic features: Secondary | ICD-10-CM | POA: Diagnosis not present

## 2023-06-17 DIAGNOSIS — F1220 Cannabis dependence, uncomplicated: Secondary | ICD-10-CM | POA: Diagnosis not present

## 2023-06-17 DIAGNOSIS — T50902A Poisoning by unspecified drugs, medicaments and biological substances, intentional self-harm, initial encounter: Secondary | ICD-10-CM | POA: Diagnosis not present

## 2023-06-17 DIAGNOSIS — F17210 Nicotine dependence, cigarettes, uncomplicated: Secondary | ICD-10-CM | POA: Diagnosis not present

## 2023-06-17 DIAGNOSIS — Z62811 Personal history of psychological abuse in childhood: Secondary | ICD-10-CM | POA: Diagnosis not present

## 2023-06-17 DIAGNOSIS — Z91148 Patient's other noncompliance with medication regimen for other reason: Secondary | ICD-10-CM | POA: Diagnosis not present

## 2023-06-17 DIAGNOSIS — F909 Attention-deficit hyperactivity disorder, unspecified type: Secondary | ICD-10-CM | POA: Diagnosis not present

## 2023-06-17 DIAGNOSIS — Z62810 Personal history of physical and sexual abuse in childhood: Secondary | ICD-10-CM | POA: Diagnosis not present

## 2023-06-17 DIAGNOSIS — R001 Bradycardia, unspecified: Secondary | ICD-10-CM | POA: Insufficient documentation

## 2023-06-17 DIAGNOSIS — R45851 Suicidal ideations: Secondary | ICD-10-CM | POA: Diagnosis not present

## 2023-06-17 MED ORDER — LAMOTRIGINE 25 MG PO TABS: 25.0000 mg | ORAL_TABLET | Freq: Every day | ORAL | Status: AC

## 2023-06-17 NOTE — TOC Transition Note (Signed)
Transition of Care [facility]) - Discharge Note   Patient Details  Name: Steven Hutchinson MRN: [id number] Date of Birth: Sep 18, 1990  Transition of Care [facility]) CM/SW Contact:  Carley Hammed, LCSW Phone Number: 06/17/2023, 8:34 AM   Clinical Narrative:    Pt to be transported to [facility] via [place], under IVC. Accepting MD is Dr. Aida Puffer.  Call to report is [phone].  Please send copies of IVC with pt.   Final next level of care: Psychiatric Hospital Barriers to Discharge: Barriers Resolved   Patient Goals and CMS Choice            Discharge Placement              Patient chooses bed at:  [facility]) Patient to be transferred to facility by: [facility]   Patient and family notified of of transfer: 06/17/23  Discharge Plan and Services Additional resources added to the After Visit Summary for                                       Social Drivers of Health (SDOH) Interventions SDOH Screenings   Food Insecurity: Food Insecurity Present (06/15/2023)  Housing: High Risk (06/15/2023)  Transportation Needs: Unmet Transportation Needs (06/15/2023)  Utilities: At Risk (06/15/2023)  Tobacco Use: High Risk (06/15/2023)     Readmission Risk Interventions     No data to display

## 2023-06-17 NOTE — Progress Notes (Signed)
RN called [employer] and gave report to [place]  and she stated understanding IV has been removed and pt is waiting for sheriff to be picked up. IVC paperwork and AVS in chart

## 2023-06-17 NOTE — Discharge Instructions (Signed)
Steven Hutchinson,  You were in the hospital because of a drug overdose. You have been started on Lamictal and will be discharged to an inpatient behavioral health facility.

## 2023-06-17 NOTE — Discharge Summary (Signed)
Physician Discharge Summary   Patient: Steven Hutchinson MRN: [id number] DOB: Dec 04, 1990  Admit date:     06/14/2023  Discharge date: 06/17/23  Discharge Physician: Jacquelin Hawking, MD   PCP: Pcp, No   Recommendations at discharge:  PCP visit for hospital follow-up Behavioral health follow-up Repeat metabolic panel to assess renal function  Discharge Diagnoses: Principal Problem:   Intentional overdose of drug in tablet form [facility]) Active Problems:   History of depression   Sinus bradycardia  Resolved Problems:   Leukocytosis  Hospital Course: Steven Hutchinson is a 32 y.o. male with a history of depression.  Patient presented secondary to intentional overdose.  He was observed via telemetry with evidence of asymptomatic sinus bradycardia. Psychiatry recommendation for inpatient behavioral health admission.  Assessment and Plan:  Intentional overdose Patient with intentional overdose of unknown medication. Psychiatry consulted with recommendation for inpatient behavioral health admission. Patient IVCd. Poison control contacted with recommendations for 24 hours of medical observation prior to discharge, which is completed.   Bipolar/Depression Patient is not on medication management as an outpatient. Psychiatry consulted and started Lamictal 25 mg daily at bedtime.   Leukocytosis WBC of 12,800 on admission. Unclear etiology. No evidence of acute illness. Resolved.   Elevated creatinine Mild. Creatinine of 1.27 on admission. No recent baseline available. Stable.   Sinus bradycardia Heart rate as low as 40s overnight. Improved to 60s prior to discharge. Bradycardia was sinus and asymptomatic. No intervention needed.   Consultants: Psychiatry Procedures performed: None  Disposition:  Inpatient behavioral health facility Diet recommendation: Regular diet   DISCHARGE MEDICATION: Allergies as of 06/17/2023   No Known Allergies      Medication List     STOP taking these  medications    acyclovir 400 MG tablet Commonly known as: ZOVIRAX       TAKE these medications    acetaminophen 325 MG tablet Commonly known as: TYLENOL Take 650 mg by mouth every 6 (six) hours as needed. Pain.   ascorbic acid 500 MG tablet Commonly known as: VITAMIN C Take 500 mg by mouth once a week.   lamoTRIgine 25 MG tablet Commonly known as: LAMICTAL Take 1 tablet (25 mg total) by mouth at bedtime.   multivitamin with minerals Tabs tablet Take 1 tablet by mouth daily.        Discharge Exam: BP 102/69 (BP Location: Left Arm)   Pulse (!) 50   Temp 98.3 F (36.8 C) (Oral)   Resp 17   Ht 5\' 9"  (1.753 m)   Wt 54.4 kg   SpO2 100%   BMI 17.71 kg/m   General exam: Appears calm and comfortable    Condition at discharge: stable  The results of significant diagnostics from this hospitalization (including imaging, microbiology, ancillary and laboratory) are listed below for reference.   Imaging Studies: No results found.  Microbiology: Results for orders placed or performed during the hospital encounter of 07/15/10  Surgical pcr screen     Status: None   Collection Time: 07/15/10  7:45 AM  Result Value Ref Range Status   MRSA, PCR NEGATIVE NEGATIVE Final   Staphylococcus aureus  NEGATIVE Final    NEGATIVE        The Xpert SA Assay (FDA approved for NASAL specimens only), is one component of a comprehensive surveillance program.  It is not intended to diagnose infection nor to guide or monitor treatment.    Labs: CBC: Recent Labs  Lab 06/14/23 2131 06/15/23 1148  WBC 12.8*  6.9  NEUTROABS  --  5.1  HGB 14.7 13.9  HCT 42.9 40.4  MCV 93.1 93.5  PLT 264 235   Basic Metabolic Panel: Recent Labs  Lab 06/14/23 2131 06/15/23 1148  NA 138 140  K 3.9 3.6  CL 106 106  CO2 24 26  GLUCOSE 78 98  BUN 11 11  CREATININE 1.27* 1.27*  CALCIUM 9.2 8.7*  MG  --  1.9   Liver Function Tests: Recent Labs  Lab 06/14/23 2131 06/15/23 1148  AST 29  27  ALT 21 19  ALKPHOS 58 57  BILITOT 0.9 1.0  PROT 6.7 5.6*  ALBUMIN 4.0 3.5    Discharge time spent: 35 minutes.  Signed: Jacquelin Hawking, MD [facility] 06/17/2023

## 2023-06-17 NOTE — Progress Notes (Signed)
Pt Blue Iphone returned to him from security. Pt clothes returned to him from the ED RN and pt verified that it was his clothes. Sheriff outside door.
